# Patient Record
Sex: Male | Born: 1995
Health system: Southern US, Community
[De-identification: ages and names within clinical notes are randomized; demographics above are authoritative.]

## PROBLEM LIST (undated history)

## (undated) DIAGNOSIS — R569 Unspecified convulsions: Secondary | ICD-10-CM

## (undated) HISTORY — DX: Unspecified convulsions: R56.9

---

## 2016-04-03 ENCOUNTER — Emergency Department (HOSPITAL_COMMUNITY): Payer: BLUE CROSS/BLUE SHIELD

## 2016-04-03 ENCOUNTER — Emergency Department (HOSPITAL_COMMUNITY)
Admission: EM | Admit: 2016-04-03 | Discharge: 2016-04-03 | Disposition: A | Discharge status: Home / Self Care | Payer: BLUE CROSS/BLUE SHIELD | Attending: Emergency Medicine | Admitting: Emergency Medicine

## 2016-04-03 ENCOUNTER — Encounter (HOSPITAL_COMMUNITY): Payer: Self-pay | Admitting: Emergency Medicine

## 2016-04-03 DIAGNOSIS — Y92410 Unspecified street and highway as the place of occurrence of the external cause: Secondary | ICD-10-CM | POA: Diagnosis not present

## 2016-04-03 DIAGNOSIS — M25561 Pain in right knee: Secondary | ICD-10-CM

## 2016-04-03 DIAGNOSIS — Y999 Unspecified external cause status: Secondary | ICD-10-CM | POA: Diagnosis not present

## 2016-04-03 DIAGNOSIS — S80211A Abrasion, right knee, initial encounter: Secondary | ICD-10-CM | POA: Insufficient documentation

## 2016-04-03 DIAGNOSIS — T148XXA Other injury of unspecified body region, initial encounter: Secondary | ICD-10-CM

## 2016-04-03 DIAGNOSIS — Y9389 Activity, other specified: Secondary | ICD-10-CM | POA: Diagnosis not present

## 2016-04-03 DIAGNOSIS — S8991XA Unspecified injury of right lower leg, initial encounter: Secondary | ICD-10-CM | POA: Diagnosis present

## 2016-04-03 DIAGNOSIS — S60511A Abrasion of right hand, initial encounter: Secondary | ICD-10-CM | POA: Diagnosis not present

## 2016-04-03 MED ORDER — IBUPROFEN 800 MG PO TABS: 800.0000 mg | ORAL_TABLET | Freq: Three times a day (TID) | ORAL | 0 refills | Status: AC | PRN
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
## 2016-04-03 NOTE — Discharge Instructions (Signed)
Keep abrasions clean with soap and water. Ibuprofen as needed for pain. Ice affected area for additional pain relief.  Follow up with your primary care provider if symptoms are not improving in one week.  Return to ER for new or worsening symptoms, any additional concerns.
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
## 2016-04-03 NOTE — ED Notes (Signed)
Patient was alert, oriented and stable upon discharge. RN went over AVS and patient had no further questions.  

## 2016-09-23 ENCOUNTER — Emergency Department (HOSPITAL_COMMUNITY): Payer: Self-pay

## 2016-09-23 ENCOUNTER — Encounter (HOSPITAL_COMMUNITY): Payer: Self-pay | Admitting: Emergency Medicine

## 2016-09-23 ENCOUNTER — Inpatient Hospital Stay (HOSPITAL_COMMUNITY): Payer: Self-pay

## 2016-09-23 ENCOUNTER — Inpatient Hospital Stay (HOSPITAL_COMMUNITY)
Admission: EM | Admit: 2016-09-23 | Discharge: 2016-09-26 | DRG: 100 | Disposition: A | Discharge status: Home / Self Care | Payer: Self-pay | Attending: Internal Medicine | Admitting: Internal Medicine

## 2016-09-23 DIAGNOSIS — G9340 Encephalopathy, unspecified: Secondary | ICD-10-CM | POA: Diagnosis present

## 2016-09-23 DIAGNOSIS — F10129 Alcohol abuse with intoxication, unspecified: Secondary | ICD-10-CM | POA: Diagnosis present

## 2016-09-23 DIAGNOSIS — Z01818 Encounter for other preprocedural examination: Secondary | ICD-10-CM

## 2016-09-23 DIAGNOSIS — Y906 Blood alcohol level of 120-199 mg/100 ml: Secondary | ICD-10-CM | POA: Diagnosis present

## 2016-09-23 DIAGNOSIS — R64 Cachexia: Secondary | ICD-10-CM | POA: Diagnosis present

## 2016-09-23 DIAGNOSIS — J9601 Acute respiratory failure with hypoxia: Secondary | ICD-10-CM | POA: Diagnosis present

## 2016-09-23 DIAGNOSIS — J324 Chronic pansinusitis: Secondary | ICD-10-CM | POA: Diagnosis present

## 2016-09-23 DIAGNOSIS — G40401 Other generalized epilepsy and epileptic syndromes, not intractable, with status epilepticus: Principal | ICD-10-CM | POA: Diagnosis present

## 2016-09-23 DIAGNOSIS — R402142 Coma scale, eyes open, spontaneous, at arrival to emergency department: Secondary | ICD-10-CM | POA: Diagnosis present

## 2016-09-23 DIAGNOSIS — R4189 Other symptoms and signs involving cognitive functions and awareness: Secondary | ICD-10-CM

## 2016-09-23 DIAGNOSIS — F14129 Cocaine abuse with intoxication, unspecified: Secondary | ICD-10-CM | POA: Diagnosis present

## 2016-09-23 DIAGNOSIS — F449 Dissociative and conversion disorder, unspecified: Secondary | ICD-10-CM | POA: Diagnosis present

## 2016-09-23 DIAGNOSIS — Z820 Family history of epilepsy and other diseases of the nervous system: Secondary | ICD-10-CM

## 2016-09-23 DIAGNOSIS — R569 Unspecified convulsions: Secondary | ICD-10-CM

## 2016-09-23 DIAGNOSIS — G40901 Epilepsy, unspecified, not intractable, with status epilepticus: Secondary | ICD-10-CM

## 2016-09-23 DIAGNOSIS — R402312 Coma scale, best motor response, none, at arrival to emergency department: Secondary | ICD-10-CM | POA: Diagnosis present

## 2016-09-23 DIAGNOSIS — R402212 Coma scale, best verbal response, none, at arrival to emergency department: Secondary | ICD-10-CM | POA: Diagnosis present

## 2016-09-23 LAB — BASIC METABOLIC PANEL
ANION GAP: 13 (ref 5–15)
BUN: 9 mg/dL (ref 6–20)
CALCIUM: 8.7 mg/dL — AB (ref 8.9–10.3)
CO2: 22 mmol/L (ref 22–32)
CREATININE: 0.95 mg/dL (ref 0.61–1.24)
Chloride: 105 mmol/L (ref 101–111)
GFR calc Af Amer: >60 mL/min (ref >60)
GFR calc non Af Amer: >60 mL/min (ref >60)
Glucose, Bld: 96 mg/dL (ref 65–99)
Potassium: 3.9 mmol/L (ref 3.5–5.1)
Sodium: 140 mmol/L (ref 135–145)

## 2016-09-23 LAB — HEPATIC FUNCTION PANEL
ALBUMIN: 4.8 g/dL (ref 3.5–5.0)
ALK PHOS: 84 U/L (ref 38–126)
ALT: 13 U/L — ABNORMAL LOW (ref 17–63)
AST: 23 U/L (ref 15–41)
BILIRUBIN TOTAL: 0.9 mg/dL (ref 0.3–1.2)
Bilirubin, Direct: 0.1 mg/dL (ref 0.1–0.5)
Indirect Bilirubin: 0.8 mg/dL (ref 0.3–0.9)
TOTAL PROTEIN: 8.1 g/dL (ref 6.5–8.1)

## 2016-09-23 LAB — CBC WITH DIFFERENTIAL/PLATELET
Basophils Absolute: 0 10*3/uL (ref 0.0–0.1)
Basophils Relative: 0 %
EOS ABS: 0.1 10*3/uL (ref 0.0–0.7)
Eosinophils Relative: 1 %
HCT: 41 % (ref 39.0–52.0)
HEMOGLOBIN: 14.8 g/dL (ref 13.0–17.0)
LYMPHS ABS: 1.9 10*3/uL (ref 0.7–4.0)
LYMPHS PCT: 19 %
MCH: 29.3 pg (ref 26.0–34.0)
MCHC: 36.1 g/dL — AB (ref 30.0–36.0)
MCV: 81.2 fL (ref 78.0–100.0)
MONOS PCT: 7 %
Monocytes Absolute: 0.7 10*3/uL (ref 0.1–1.0)
NEUTROS PCT: 74 %
Neutro Abs: 7.5 10*3/uL (ref 1.7–7.7)
Platelets: 214 10*3/uL (ref 150–400)
RBC: 5.05 MIL/uL (ref 4.22–5.81)
RDW: 12.3 % (ref 11.5–15.5)
WBC: 10.2 10*3/uL (ref 4.0–10.5)

## 2016-09-23 LAB — RAPID URINE DRUG SCREEN, HOSP PERFORMED
AMPHETAMINES: NOT DETECTED
BARBITURATES: NOT DETECTED
Benzodiazepines: NOT DETECTED
Cocaine: POSITIVE — AB
Opiates: NOT DETECTED
TETRAHYDROCANNABINOL: NOT DETECTED

## 2016-09-23 LAB — CBC
HEMATOCRIT: 38.9 % — AB (ref 39.0–52.0)
Hemoglobin: 13.9 g/dL (ref 13.0–17.0)
MCH: 29 pg (ref 26.0–34.0)
MCHC: 35.7 g/dL (ref 30.0–36.0)
MCV: 81 fL (ref 78.0–100.0)
PLATELETS: 206 10*3/uL (ref 150–400)
RBC: 4.8 MIL/uL (ref 4.22–5.81)
RDW: 12.1 % (ref 11.5–15.5)
WBC: 17 10*3/uL — AB (ref 4.0–10.5)

## 2016-09-23 LAB — BLOOD GAS, ARTERIAL
Acid-base deficit: 2.1 mmol/L — ABNORMAL HIGH (ref 0.0–2.0)
Bicarbonate: 22.5 mmol/L (ref 20.0–28.0)
Drawn by: 11249
FIO2: 100
O2 SAT: 99.6 %
PEEP: 5 cmH2O
PH ART: 7.38 (ref 7.350–7.450)
Patient temperature: 96.8
RATE: 14 resp/min
VT: 580 mL
pCO2 arterial: 38.5 mmHg (ref 32.0–48.0)
pO2, Arterial: 415 mmHg — ABNORMAL HIGH (ref 83.0–108.0)

## 2016-09-23 LAB — I-STAT CHEM 8, ED
BUN: 11 mg/dL (ref 6–20)
CALCIUM ION: 1.06 mmol/L — AB (ref 1.15–1.40)
CHLORIDE: 101 mmol/L (ref 101–111)
Creatinine, Ser: 1.1 mg/dL (ref 0.61–1.24)
Glucose, Bld: 109 mg/dL — ABNORMAL HIGH (ref 65–99)
HCT: 44 % (ref 39.0–52.0)
Hemoglobin: 15 g/dL (ref 13.0–17.0)
POTASSIUM: 3.6 mmol/L (ref 3.5–5.1)
SODIUM: 140 mmol/L (ref 135–145)
TCO2: 26 mmol/L (ref 0–100)

## 2016-09-23 LAB — ACETAMINOPHEN LEVEL: Acetaminophen (Tylenol), Serum: <10 ug/mL — ABNORMAL LOW (ref 10–30)

## 2016-09-23 LAB — HIV ANTIBODY (ROUTINE TESTING W REFLEX): HIV Screen 4th Generation wRfx: NONREACTIVE

## 2016-09-23 LAB — ETHANOL: ALCOHOL ETHYL (B): 197 mg/dL — AB (ref <5)

## 2016-09-23 LAB — PHOSPHORUS: Phosphorus: 4.4 mg/dL (ref 2.5–4.6)

## 2016-09-23 LAB — MAGNESIUM
MAGNESIUM: 2.1 mg/dL (ref 1.7–2.4)
Magnesium: 1.9 mg/dL (ref 1.7–2.4)

## 2016-09-23 LAB — CBG MONITORING, ED: Glucose-Capillary: 109 mg/dL — ABNORMAL HIGH (ref 65–99)

## 2016-09-23 LAB — MRSA PCR SCREENING: MRSA by PCR: NEGATIVE

## 2016-09-23 MED ORDER — NALOXONE HCL 0.4 MG/ML IJ SOLN: 0.4000 mg | Freq: Once | INTRAMUSCULAR | Status: DC

## 2016-09-23 MED ORDER — FAMOTIDINE IN NACL 20-0.9 MG/50ML-% IV SOLN
20.0000 mg | Freq: Two times a day (BID) | INTRAVENOUS | Status: DC
  Administered 2016-09-23 – 2016-09-24 (×3): 20 mg via INTRAVENOUS
  Filled 2016-09-23 (×3): qty 50

## 2016-09-23 MED ORDER — FENTANYL CITRATE (PF) 100 MCG/2ML IJ SOLN
100.0000 ug | INTRAMUSCULAR | Status: DC | PRN
  Administered 2016-09-23: 100 ug via INTRAVENOUS
  Filled 2016-09-23: qty 2

## 2016-09-23 MED ORDER — PROPOFOL 1000 MG/100ML IV EMUL
INTRAVENOUS | Status: AC
  Filled 2016-09-23: qty 100

## 2016-09-23 MED ORDER — NALOXONE HCL 0.4 MG/ML IJ SOLN
INTRAMUSCULAR | Status: AC
  Filled 2016-09-23: qty 1

## 2016-09-23 MED ORDER — HEPARIN SODIUM (PORCINE) 5000 UNIT/ML IJ SOLN: 5000.0000 [IU] | Freq: Three times a day (TID) | INTRAMUSCULAR | Status: DC

## 2016-09-23 MED ORDER — SODIUM CHLORIDE 0.9 % IV SOLN
250.0000 mL | INTRAVENOUS | Status: DC | PRN
Start: 2016-09-23 — End: 2016-09-26

## 2016-09-23 MED ORDER — PROPOFOL 1000 MG/100ML IV EMUL
0.0000 ug/kg/min | INTRAVENOUS | Status: DC
  Administered 2016-09-23: 30 ug/kg/min via INTRAVENOUS
  Administered 2016-09-23 (×2): 50 ug/kg/min via INTRAVENOUS
  Administered 2016-09-23: 40 ug/kg/min via INTRAVENOUS
  Administered 2016-09-23: 50 ug/kg/min via INTRAVENOUS
  Administered 2016-09-24: 30 ug/kg/min via INTRAVENOUS
  Filled 2016-09-23 (×6): qty 100

## 2016-09-23 MED ORDER — PROPOFOL 1000 MG/100ML IV EMUL
0.0000 ug/kg/min | INTRAVENOUS | Status: DC
  Administered 2016-09-23: 30 ug/kg/min via INTRAVENOUS

## 2016-09-23 MED ORDER — SODIUM CHLORIDE 0.9 % IV SOLN: 0.0000 ug/min | INTRAVENOUS | Status: DC

## 2016-09-23 MED ORDER — ACETAMINOPHEN 160 MG/5ML PO SOLN
500.0000 mg | Freq: Four times a day (QID) | ORAL | Status: DC | PRN
  Administered 2016-09-23: 500 mg
  Filled 2016-09-23: qty 20.3

## 2016-09-23 MED ORDER — LACTATED RINGERS IV SOLN
INTRAVENOUS | Status: DC
  Administered 2016-09-23 – 2016-09-24 (×3): via INTRAVENOUS

## 2016-09-23 MED ORDER — SODIUM CHLORIDE 0.9 % IV SOLN
500.0000 mg | Freq: Two times a day (BID) | INTRAVENOUS | Status: DC
  Administered 2016-09-23 – 2016-09-24 (×3): 500 mg via INTRAVENOUS
  Filled 2016-09-23 (×4): qty 5

## 2016-09-23 MED ORDER — SODIUM CHLORIDE 0.9 % IV SOLN
1000.0000 mg | Freq: Once | INTRAVENOUS | Status: AC
  Administered 2016-09-23: 1000 mg via INTRAVENOUS
  Filled 2016-09-23: qty 10

## 2016-09-23 MED ORDER — LIDOCAINE HCL 2 % EX GEL
CUTANEOUS | Status: AC
  Filled 2016-09-23: qty 11

## 2016-09-23 MED ORDER — LORAZEPAM 2 MG/ML IJ SOLN
1.0000 mg | Freq: Once | INTRAMUSCULAR | Status: AC
  Administered 2016-09-23: 1 mg via INTRAVENOUS

## 2016-09-23 MED ORDER — ENOXAPARIN SODIUM 40 MG/0.4ML ~~LOC~~ SOLN
40.0000 mg | Freq: Every day | SUBCUTANEOUS | Status: DC
  Administered 2016-09-23 – 2016-09-24 (×2): 40 mg via SUBCUTANEOUS
  Filled 2016-09-23 (×2): qty 0.4

## 2016-09-23 MED ORDER — CHLORHEXIDINE GLUCONATE 0.12% ORAL RINSE (MEDLINE KIT)
15.0000 mL | Freq: Two times a day (BID) | OROMUCOSAL | Status: DC
  Administered 2016-09-23 – 2016-09-24 (×3): 15 mL via OROMUCOSAL

## 2016-09-23 MED ORDER — PANTOPRAZOLE SODIUM 40 MG IV SOLR
40.0000 mg | Freq: Every day | INTRAVENOUS | Status: DC
  Administered 2016-09-23: 40 mg via INTRAVENOUS
  Filled 2016-09-23: qty 40

## 2016-09-23 MED ORDER — LORAZEPAM 2 MG/ML IJ SOLN
INTRAMUSCULAR | Status: AC
  Administered 2016-09-23: 1 mg via INTRAVENOUS
  Filled 2016-09-23: qty 1

## 2016-09-23 MED ORDER — FENTANYL CITRATE (PF) 100 MCG/2ML IJ SOLN: 100.0000 ug | INTRAMUSCULAR | Status: DC | PRN

## 2016-09-23 MED ORDER — ORAL CARE MOUTH RINSE
15.0000 mL | OROMUCOSAL | Status: DC
  Administered 2016-09-23 – 2016-09-24 (×13): 15 mL via OROMUCOSAL

## 2016-09-23 NOTE — ED Notes (Signed)
Patient moving arms and legs-attempts to open eyes when name called. Pupils are 18mm bilaterally and equal with brisk reaction.
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
## 2016-09-23 NOTE — Progress Notes (Signed)
LTM changed to routine EEG per Dr. Laurence Slate. EEG Completed; Results Pending

## 2016-09-23 NOTE — ED Notes (Signed)
E-Link notified of patient's agitation-will place order for sedation

## 2016-09-23 NOTE — ED Notes (Signed)
Pt was intubated by provider at 1:43am  amidate 20mg   given then flush  Rocuronium 60 mg given then flush  Respiratory in room bagging  24 at the lip/ size 7.5 tube

## 2016-09-23 NOTE — Progress Notes (Signed)
Initial Nutrition Assessment  DOCUMENTATION CODES:   Not applicable  INTERVENTION:  If unable to extubate, Recommend TF via OGT with Vital AF 1.2 at goal rate of 45 ml/h (1080 ml per day) and Prostat 30 ml once daily. Tube feeding regimen with current propofol rate will provide 1916 kcals, 96 gm protein, 875 ml free water daily.  NUTRITION DIAGNOSIS:   Inadequate oral intake related to inability to eat as evidenced by NPO status.  GOAL:   Patient will meet greater than or equal to 90% of their needs  MONITOR:   Vent status, Skin, Weight trends, Labs, I & O's  REASON FOR ASSESSMENT:   Consult Assessment of nutrition requirement/status (TF recommendations)  ASSESSMENT:   21 yo M presented because of seizure like activity, found to be ETOH and cocaine positive. Intubated for airway protection.  Patient is currently intubated on ventilator support MV: 10.3 L/min Temp (24hrs), Avg:99.7 F (37.6 C), Min:96.1 F (35.6 C), Max:101.3 F (38.5 C)  Propofol: 19.7 ml/hr which provides 520 kcal/day  Per MD note, considering extubation in the afternoon/evening atfer hydration but will start TF in the morning is unable to extubate.  Nutrition-Focused physical exam completed. Findings are no fat depletion, mild to moderate muscle depletion, and no edema.   Labs and medications reviewed.   Diet Order:  Diet NPO time specified  Skin:  Reviewed, no issues  Last BM:  Unknown  Height:   Ht Readings from Last 1 Encounters:  09/23/16 5\' 10"  (1.778 m)    Weight:   Wt Readings from Last 1 Encounters:  09/23/16 136 lb 3.9 oz (61.8 kg)    Ideal Body Weight:  75.45 kg  BMI:  Body mass index is 19.55 kg/m.  Estimated Nutritional Needs:   Kcal:  1956  Protein:  95-110 grams  Fluid:  Per MD  EDUCATION NEEDS:   No education needs identified at this time  Roslyn Smiling, MS, RD, LDN Pager # [phone] After hours/ weekend pager # [phone]
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
## 2016-09-23 NOTE — ED Provider Notes (Signed)
[facility]-EMERGENCY DEPT Provider Note   CSN: [id number] Arrival date & time: 09/23/16  0046     History   Chief Complaint Chief Complaint  Patient presents with  . Unresponsive  . Seizures    HPI Anson Peddie is a 21 y.o. male.  HPI Pt comes in with cc of seizure. LEVEL 5 CAVEAT FOR SEIZURE / AMS. Pt brought in by friends with active seizures. Per friends they were at a part and pt had 2  Episodes of seizures, tonic clonic jerking lasting few seconds each. After the 2nd episode they decided to drive him here. They are not aware of any medical conditions. There was drinking at the party, the friends report that pt had maybe mentioned taking mollies at some point but they didn't actually see any drugs.   History reviewed. No pertinent past medical history.  Patient Active Problem List   Diagnosis Date Noted  . Encounter for intubation   . Seizures (HCC) 09/23/2016  . Seizure (HCC) 09/23/2016  . Unresponsive state   . Status epilepticus (HCC)     History reviewed. No pertinent surgical history.     Home Medications    Prior to Admission medications   Medication Sig Start Date End Date Taking? Authorizing Provider  ibuprofen (ADVIL,MOTRIN) 800 MG tablet Take 1 tablet (800 mg total) by mouth every 8 (eight) hours as needed. Patient not taking: Reported on 09/23/2016 04/03/16   Ward, Chase Picket, PA-C    Family History No family history on file.  Social History Social History  Substance Use Topics  . Smoking status: Never Smoker  . Smokeless tobacco: Never Used  . Alcohol use Yes     Allergies   Patient has no known allergies.   Review of Systems Review of Systems  Unable to perform ROS: Patient unresponsive     Physical Exam Updated Vital Signs BP (!) 103/52   Pulse (!) 52   Temp 98 F (36.7 C) (Oral)   Resp 15   Ht 5\' 10"  (1.778 m)   Wt 65.2 kg (143 lb 11.8 oz)   SpO2 98%   BMI 20.62 kg/m   Physical Exam  Constitutional: He appears  well-developed.  HENT:  Head: Atraumatic.  Eyes:  4 mm and equal - upwards gaze initially that normalized  Neck: Neck supple.  Cardiovascular: Normal rate.   Pulmonary/Chest: Effort normal. He has no wheezes.  Abdominal: Soft.  Neurological:  GCS - 6 (e-4/v-1/m-1). No gag reflex  Skin: Skin is warm. He is diaphoretic.  Nursing note and vitals reviewed.    ED Treatments / Results  Labs (all labs ordered are listed, but only abnormal results are displayed) Labs Reviewed  CBC WITH DIFFERENTIAL/PLATELET - Abnormal; Notable for the following:       Result Value   MCHC 36.1 (*)    All other components within normal limits  ACETAMINOPHEN LEVEL - Abnormal; Notable for the following:    Acetaminophen (Tylenol), Serum <10 (*)    All other components within normal limits  ETHANOL - Abnormal; Notable for the following:    Alcohol, Ethyl (B) 197 (*)    All other components within normal limits  HEPATIC FUNCTION PANEL - Abnormal; Notable for the following:    ALT 13 (*)    All other components within normal limits  RAPID URINE DRUG SCREEN, HOSP PERFORMED - Abnormal; Notable for the following:    Cocaine POSITIVE (*)    All other components within normal limits  BLOOD GAS, ARTERIAL -  Abnormal; Notable for the following:    pO2, Arterial 415 (*)    Acid-base deficit 2.1 (*)    All other components within normal limits  CBC - Abnormal; Notable for the following:    WBC 17.0 (*)    HCT 38.9 (*)    All other components within normal limits  BASIC METABOLIC PANEL - Abnormal; Notable for the following:    Calcium 8.7 (*)    All other components within normal limits  CBC - Abnormal; Notable for the following:    WBC 11.5 (*)    All other components within normal limits  CBG MONITORING, ED - Abnormal; Notable for the following:    Glucose-Capillary 109 (*)    All other components within normal limits  I-STAT CHEM 8, ED - Abnormal; Notable for the following:    Glucose, Bld 109 (*)     Calcium, Ion 1.06 (*)    All other components within normal limits  MRSA PCR SCREENING  MAGNESIUM  PHOSPHORUS  HIV ANTIBODY (ROUTINE TESTING)  MAGNESIUM  BASIC METABOLIC PANEL  CBG MONITORING, ED    EKG  EKG Interpretation  Date/Time:  Saturday September 23 2016 00:48:13 EDT Ventricular Rate:  85 PR Interval:    QRS Duration: 81 QT Interval:  354 QTC Calculation: 421 R Axis:   77 Text Interpretation:  Sinus rhythm Consider right atrial enlargement No acute changes No old tracing to compare Confirmed by Derwood Kaplan ([id number]) on 09/23/2016 12:56:48 AM       Radiology Dg Chest Port 1 View  Result Date: 09/24/2016 CLINICAL DATA:  Intubation. EXAM: PORTABLE CHEST 1 VIEW COMPARISON:  Chest radiograph September 23, 2016 FINDINGS: Endotracheal tube tip projects 2.5 cm above the carina. Nasogastric tube past the GE junction, distal tip out of field-of-view. Cardiomediastinal silhouette is normal. No pleural effusions or focal consolidations. Trachea projects midline and there is no pneumothorax. Soft tissue planes and included osseous structures are non-suspicious. IMPRESSION: Endotracheal tube tip projects 2.5 cm above the carina. Nasogastric tube past the GE junction. No acute cardiopulmonary process. Electronically Signed   By: Awilda Metro M.D.   On: 09/24/2016 03:38    Procedures Procedures (including critical care time)  INTUBATION Performed by: Derwood Kaplan  Required items: required blood products, implants, devices, and special equipment available Patient identity confirmed: provided demographic data and hospital-assigned identification number Time out: Immediately prior to procedure a "time out" was called to verify the correct patient, procedure, equipment, support staff and site/side marked as required.  Indications: airway protection  Intubation method: Direct Laryngoscopy   Preoxygenation: BVM  Sedatives: Etomidate Paralytic: Rocuronium  Tube Size: 7.5  cuffed  Post-procedure assessment: chest rise and ETCO2 monitor Breath sounds: equal and absent over the epigastrium Tube secured with: ETT holder Chest x-ray interpreted by radiologist and me.  Chest x-ray findings: endotracheal tube in appropriate position  Patient tolerated the procedure well with no immediate complications.   CRITICAL CARE Performed by: Derwood Kaplan   Total critical care time: 40 minutes  Critical care time was exclusive of separately billable procedures and treating other patients.  Critical care was necessary to treat or prevent imminent or life-threatening deterioration.  Critical care was time spent personally by me on the following activities: development of treatment plan with patient and/or surrogate as well as nursing, discussions with consultants, evaluation of patient's response to treatment, examination of patient, obtaining history from patient or surrogate, ordering and performing treatments and interventions, ordering and review of laboratory studies, ordering  and review of radiographic studies, pulse oximetry and re-evaluation of patient's condition.   Medications Ordered in ED Medications  lidocaine (XYLOCAINE) 2 % jelly (not administered)  propofol (DIPRIVAN) 1000 MG/100ML infusion (not administered)  0.9 %  sodium chloride infusion (not administered)  propofol (DIPRIVAN) 1000 MG/100ML infusion (not administered)  levETIRAcetam (KEPPRA) tablet 500 mg (500 mg Oral Given 09/25/16 1046)  acetaminophen (TYLENOL) tablet 650 mg (650 mg Oral Given 09/25/16 0508)  feeding supplement (ENSURE ENLIVE) (ENSURE ENLIVE) liquid 237 mL (237 mLs Oral Not Given 09/25/16 1100)  LORazepam (ATIVAN) injection 1 mg (1 mg Intravenous Given 09/23/16 0128)  levETIRAcetam (KEPPRA) 1,000 mg in sodium chloride 0.9 % 100 mL IVPB (0 mg Intravenous Stopped 09/23/16 0358)     Initial Impression / Assessment and Plan / ED Course  I have reviewed the triage vital signs and  the nursing notes.  Pertinent labs & imaging results that were available during my care of the patient were reviewed by me and considered in my medical decision making (see chart for details).  Clinical Course as of Sep 25 1448  Sat Sep 23, 2016  0155 Pt had seizure # 4 and 5 in the ER. Poor gag. GCS never improved between the seizures. Worried that he might be having subclinical seizures as well. Will intubate.  [AN]    Clinical Course User Index [AN] Derwood Kaplan, MD    Pt comes in unresponsive. CBG is normal. Pt was having seizure like activity, one of them witnessed here. Pt essentially having status epilepticus. We suspect toxin ingestion, although history is limited. No signs of trauma. Bed at 30 degrees right now. Close monitoring initiated. Narcan 0.4 mg ordered.  Final Clinical Impressions(s) / ED Diagnoses   Final diagnoses:  Seizure (HCC)  Unresponsive state  Status epilepticus [facility])    New Prescriptions Current Discharge Medication List       Derwood Kaplan, MD 09/25/16 1450

## 2016-09-23 NOTE — Progress Notes (Addendum)
Reason for ICU admission: Seizure, acute hypoxic resp failure  In summary: 21 yo M presented because of seizure like activity, found to be ETOH and cocaine positive. Intubated for airway protection. CT of the head is negative   Subjective : sedated on vent  No further seizure act .  EEG pending   On my assessment: Vitals:   09/23/16 0900 09/23/16 0915  BP: 108/64 114/63  Pulse: 80 84  Resp: 14 14  Temp: (!) 100.9 F (38.3 C) (!) 100.9 F (38.3 C)  SpO2: 98% 99%    Physical Exam  Constitutional: He appears well-developed. Sedated on vent  HEENT : ETT  Neck: Neck supple.  Cardiovascular: Normal rate.   Pulmonary/Chest: Effort normal. He has no wheezes.  Abdominal: Soft.  Neurological:  Sedated on vent . RAAS -1    Assessment :  1. Acute hypoxic resp failure 2. Seizure-neg CT head, EEG pending  3. ETOH/Drug abuse  Plan: - appreciate neuro assistance  - Cont Keppra .  Sedation with Diprivan/Fent  - Ativan As needed   - cont vent support until mentation improves.  - IVF, monitor UOP - PUD and DVT prophylaxis  Update: mom and dad updated at bedside 8/18  Tammy Parrett NP-C  [facility]  [phone]   09/23/2016   STAFF NOTE: Cindi Carbon, MD FACP have personally reviewed patient's available data, including medical history, events of note, physical examination and test results as part of my evaluation. I have discussed with resident/NP and other care providers such as pharmacist, RN and RRT. 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, coc, mDMA, neuro on board,  Prop to rass -2 to -3, ABg reviewed keep same MV, SBT this afternoon assess rsbi, would consider extubation in  afternoon evening if after hydration to clear etoh and no focus noted, start TF in am if not extubated, ppi, sub q hep, chem in am, some fever 101 without infection noted, may have pnuemonitis but pcxr neg, if spike further add unasyn, get sputum, I updated mom, also fever from mdma as slurce likley, may need versed for this The patient is critically ill with multiple organ systems failure and requires high complexity decision making for assessment and support, frequent evaluation and titration of therapies, application of advanced monitoring technologies and extensive interpretation of multiple databases.   Critical Care Time devoted to patient care services described in this note is 50 Minutes. This time reflects time of care of this signee: Rory Percy, MD FACP. This critical care time does not reflect procedure time, or teaching time or supervisory time of PA/NP/Med student/Med Resident etc but could involve care discussion time. Rest per NP/medical resident whose note is outlined above and that I agree with   Mcarthur Rossetti. Tyson Alias, MD, FACP Pgr: [phone] [facility] 09/23/2016 12:12 PM

## 2016-09-23 NOTE — Consult Note (Signed)
Reason for ICU admission: Seizure, acute hypoxic resp failure  In summary: 21 yo M presented because of seizure like activity, found to be ETOH and cocaine positive. Intubated for airway protection. CT of the head is negative   On my assessment: BP 112/75   Pulse 89   Resp (!) 21   SpO2 92%   Physical Exam  Constitutional: He appears well-developed.  Eyes: 4 mm and equal - upwards gaze initially that normalized  Neck: Neck supple.  Cardiovascular: Normal rate.   Pulmonary/Chest: Effort normal. He has no wheezes.  Abdominal: Soft.  Neurological:  GCS - 6 (e-4/v-1/m-1). No gag reflex   I reviewed labs and imaging  Patient is critically ill in the ICU and I am managing the patient for: 1. Acute hypoxic resp failure 2. Seizure 3. Drug abuse  Plan: - admit to ICU - Keppra, EEG, CT of the head done and was negative. Neuro consult - ABG, acceptable, CXR reviewed, minima vent setting. VAP protocol - IVF, monitor UOP - PUD and DVT prophylaxis  I spent 35 min of critical care time managing the patient   Oswaldo Milian, MD CCM attending

## 2016-09-23 NOTE — ED Triage Notes (Signed)
Pt brought in by two friends after being at a house where friends say he may have consumed a good amount of alcohol and possible taken molly.  Friends unable to answer many questions regarding patients.  State he had 3 seizures in route. Found diaphoretic. Pt unresponsive at this time.

## 2016-09-23 NOTE — ED Notes (Signed)
Patient awake and shaking head-oropharynx suctioned for moderate amount clear blood tinged secretions-RT at bedside

## 2016-09-23 NOTE — ED Notes (Signed)
Patient is currently sedated on propofol-no change in vent settings-VSS/see flow sheet. MP SR with rate 70's with no ectopy. Temp foley intact and draining clear yellow urine. OGT in place draining mucous and light bile colored fluid.

## 2016-09-23 NOTE — ED Notes (Signed)
To CT and returned-patient remains unresponsive to tactile/noxious stimuli

## 2016-09-23 NOTE — Progress Notes (Signed)
Patient moving all 4 extremities, trying to self extubate. Not following commands, EEg shows no seizures.

## 2016-09-24 ENCOUNTER — Inpatient Hospital Stay (HOSPITAL_COMMUNITY): Payer: Self-pay

## 2016-09-24 DIAGNOSIS — G40901 Epilepsy, unspecified, not intractable, with status epilepticus: Secondary | ICD-10-CM

## 2016-09-24 DIAGNOSIS — R4189 Other symptoms and signs involving cognitive functions and awareness: Secondary | ICD-10-CM

## 2016-09-24 DIAGNOSIS — Z01818 Encounter for other preprocedural examination: Secondary | ICD-10-CM

## 2016-09-24 DIAGNOSIS — R569 Unspecified convulsions: Secondary | ICD-10-CM

## 2016-09-24 LAB — BASIC METABOLIC PANEL
Anion gap: 11 (ref 5–15)
BUN: 10 mg/dL (ref 6–20)
CALCIUM: 9 mg/dL (ref 8.9–10.3)
CO2: 22 mmol/L (ref 22–32)
CREATININE: 1.08 mg/dL (ref 0.61–1.24)
Chloride: 106 mmol/L (ref 101–111)
GFR calc non Af Amer: >60 mL/min (ref >60)
Glucose, Bld: 73 mg/dL (ref 65–99)
Potassium: 4.3 mmol/L (ref 3.5–5.1)
Sodium: 139 mmol/L (ref 135–145)

## 2016-09-24 LAB — CBC
HEMATOCRIT: 41.5 % (ref 39.0–52.0)
Hemoglobin: 14.1 g/dL (ref 13.0–17.0)
MCH: 28.7 pg (ref 26.0–34.0)
MCHC: 34 g/dL (ref 30.0–36.0)
MCV: 84.5 fL (ref 78.0–100.0)
PLATELETS: 171 10*3/uL (ref 150–400)
RBC: 4.91 MIL/uL (ref 4.22–5.81)
RDW: 12.8 % (ref 11.5–15.5)
WBC: 11.5 10*3/uL — ABNORMAL HIGH (ref 4.0–10.5)

## 2016-09-24 MED ORDER — ACETAMINOPHEN 325 MG PO TABS
650.0000 mg | ORAL_TABLET | Freq: Four times a day (QID) | ORAL | Status: DC | PRN
  Administered 2016-09-24 – 2016-09-26 (×4): 650 mg via ORAL
  Filled 2016-09-24 (×4): qty 2

## 2016-09-24 MED ORDER — ACETAMINOPHEN 160 MG/5ML PO SOLN: 650.0000 mg | Freq: Four times a day (QID) | ORAL | Status: DC | PRN

## 2016-09-24 MED ORDER — LEVETIRACETAM 500 MG PO TABS
500.0000 mg | ORAL_TABLET | Freq: Two times a day (BID) | ORAL | Status: DC
  Administered 2016-09-24 – 2016-09-26 (×4): 500 mg via ORAL
  Filled 2016-09-24 (×4): qty 1

## 2016-09-24 NOTE — Progress Notes (Signed)
Reason for ICU admission: Seizure, acute hypoxic resp failure  In summary: 21 yo M presented because of seizure like activity, found to be ETOH and cocaine positive. Intubated for airway protection. CT of the head is negative   Subjective : sedated on vent , wakes up , f/c.  No further seizure act .  EEG showed generalized cerebral dysfunction, and intermittent generalized spike and wave discharges with frontal predominance; no active seizures.   On my assessment: Vitals:   09/24/16 1045 09/24/16 1100  BP: 127/67 117/74  Pulse: 64 67  Resp: 15 19  Temp:    SpO2: 100% 100%    Physical Exam  Constitutional: He appears well-developed. Sedated on vent  HEENT : ETT  Neck: Neck supple.  Cardiovascular: Normal rate.   Pulmonary/Chest: Effort normal. He has no wheezes.  Abdominal: Soft.  Neurological:  Sedated on vent . RAAS -1    Assessment :  1. Acute hypoxic resp failure 2. Seizure-neg CT head, EEG pending  3. ETOH/Drug abuse  Plan: - appreciate neuro assistance  - Cont Keppra .  Wean sedation  eval for extubation .  Sedation with Diprivan/Fent  - Ativan As needed   - IVF, monitor UOP - PUD and DVT prophylaxis  Update: mom and dad updated at bedside 8/19  Tammy Parrett NP-C  [facility]  [phone]   09/24/2016   STAFF NOTE: Linwood Dibbles, MD FACP have personally reviewed patient's available data, including medical history, events of note, physical examination and test results as part of my evaluation. I have discussed with resident/NP and other care providers such as pharmacist, RN and RRT. In addition, I personally evaluated patient and elicited key findings of: awakens, rass 0, on prop, on going wua, follows commands, lungs are clear, abdo soft, pcxr I reviewed shows NO asp PNA no new infiltrates, no seizures overnight, wean aggressive now cpap 5 ps 5, goal 30 min  Met, off prop strong cough, assessing leak - present, anti seizure per  neuro, proceed with extubation, keep pos balance with poly substance, no role abx, I updated his parents in full The patient is critically ill with multiple organ systems failure and requires high complexity decision making for assessment and support, frequent evaluation and titration of therapies, application of advanced monitoring technologies and extensive interpretation of multiple databases.   Critical Care Time devoted to patient care services described in this note is 30 Minutes. This time reflects time of care of this signee: Merrie Roof, MD FACP. This critical care time does not reflect procedure time, or teaching time or supervisory time of PA/NP/Med student/Med Resident etc but could involve care discussion time. Rest per NP/medical resident whose note is outlined above and that I agree with   Lavon Paganini. Titus Mould, MD, [place] Pgr: [facility] 09/24/2016 12:58 PM

## 2016-09-24 NOTE — Progress Notes (Signed)
When suctioning pt, pt reached up and grabbed tube and pulled it out to 22cm at lip.  RT pushed tube back to 25 at the lip and checked placement with ETCO2 which had good color change with bagging.  Rt will continue to monitor.

## 2016-09-24 NOTE — Progress Notes (Signed)
RT Note: Patient was placed on wean earlier by Fannie Knee, RRT and initially was tolerating it well. Patient then was placed back on his rest mode due to increased respiratory rate of 60, increased agitation, and increased work of breathing. He is stable now on his rest mode and is maintaining well on PRVC. Rt will continue to monitor and assist as needed.

## 2016-09-24 NOTE — Progress Notes (Signed)
Subjective: Intubated and sedated on my visit. He will open his eyes occasionally and follow some commands, showing two fingers and a thumbs up when requested.   I spoke to his mother on my visit, and she noted that "this all makes sense" after discussing cEEG findings - mom has diagnosed epilepsy and brother has had seizures following drug use.  Pertinent diagnostics: CT head: normal cEEG showed generalized cerebral dysfunction, and intermittent generalized spike and wave discharges with frontal predominance; no active seizures.   Pertinent medications 500mg  Keppra BID  Physical Examination: Vitals:   09/24/16 0645 09/24/16 0758  BP: 123/66 123/62  Pulse: (!) 53 (!) 54  Resp: 14 14  Temp:    SpO2: 100% 100%    General: WDWN male.  HEENT:  Normocephalic, no lesions, without obvious abnormality.  Normal external eye and conjunctiva.  Normal external ears. Normal external nose, mucus membranes and septum.  Normal pharynx. Cardiovascular: regular rate and rhythm, pulses palpable throughout   Pulmonary: Ventilated Abdomen: Soft, non-tender Extremities: no joint deformities, effusion, or inflammation  Neurological Examination:  CN: Pupils are equal, round and symmetrically reactive. Upper face is symmetric at rest. Hearing is intact to conversational voice. Tongue is midline.  Motor: Normal bulk, tone, and strength. Sensation: Does not withdraw to noxious stimuli in the BLE but will wake to sternal rub DTRs: Diminished throughout.  Toes downgoing bilaterally. Coordination: He does not perform   Assessment:   Seizures in the setting of Substance Abuse Migraines with Hemiparesis ? Possible underlying Seizure disorder with abnormal EEG   Jay Aguirre is a 21 year old male with a history of conversion disorder, polysubstance abuse and a family history of seizure who was brought in to [facility] following two witnessed seizures in the field. He then had two additional seizures in the ED.  UDS was positive for cocaine and alcohol. EEG showed generalized epileptogenic dysfunction with a frontal predominance, suggesting  A possible  underlying epilepsy disorder that was likely revealed due to reduced seizure threshold in the setting of alcohol and cocaine use. Mother has Partial epilepsy that presented late onset.  I would suggest to keep him on Keppra 500mg  BID until follow up with an outpatient neurologist.    Recommendations: 1) Continue Keppra 500 mG BID  2) Follow up Neurology as outpatient     Bruna Potter PA-C [facility] [phone]  09/24/2016, 8:07 AM  NEUROHOSPITALIST ADDENDUM Seen and examined the patient this AM. Formulated plan as documented above. Recommendations as above.  He has  Just been extubated. EEG showed Intermittent generalized spike and wave discharges with frontal predominance, suggesting possible underlying seizure disorder as well.   No driving x 6 months Continue Keppra Avoid alcohol and cocaine and illicit substances Seizure precuations   Per [facility] statutes, patients with seizures are not allowed to drive until they have been seizure-free for six months. Use caution when using heavy equipment or power tools. Avoid working on ladders or at heights. Take showers instead of baths. Ensure the water temperature is not too high on the home water heater. Do not go swimming alone. Do not lock yourself in a room alone (i.e. bathroom). When caring for infants or small children, sit down when holding, feeding, or changing them to minimize risk of injury to the child in the event you have a seizure. Maintain good sleep hygiene. Avoid alcohol.    If [name] has another seizure, call 911 and bring them back to the ED if:  A.  The seizure lasts longer than 5 minutes.            B.  The patient doesn't wake shortly after the seizure or has new problems such as difficulty seeing, speaking or moving following the  seizure       C.  The patient was injured during the seizure       D.  The patient has a temperature over 102 F (39C)       E.  The patient vomited during the seizure and now is having trouble breathing    Georgiana Spinner Aroor MD [facility] [phone]  If 7pm to 7am, please call on call as listed on AMION.

## 2016-09-24 NOTE — Procedures (Signed)
Extubation Procedure Note  Patient Details:   Name: Jay Aguirre DOB: August 12, 1995 MRN: [id number]   Airway Documentation:     Evaluation  O2 sats: stable throughout Complications: No apparent complications Patient did tolerate procedure well. Bilateral Breath Sounds: Clear   Yes - pt currently whispering  Positive cuff leak noted.  Pt placed on [place] 2 L with humidity, no stridor noted.  Md, RN, and family in room.  Pt and family instructed on the use of incentive spirometer. Pt able to reach 1325 mL with coaching.  Jay Aguirre 09/24/2016, 1:09 PM

## 2016-09-25 MED ORDER — ENSURE ENLIVE PO LIQD
237.0000 mL | Freq: Two times a day (BID) | ORAL | Status: DC
  Administered 2016-09-25 – 2016-09-26 (×3): 237 mL via ORAL
  Filled 2016-09-25 (×4): qty 237

## 2016-09-25 NOTE — Progress Notes (Signed)
Pt arrived to 5C20 via wheelchair.  Pt ambulated from chair to bed.  Pt alert and oriented in no apparent distress and no complaints of pain.  VSS.  Will continue to monitor.  Sondra Come, RN

## 2016-09-25 NOTE — Discharge Instructions (Signed)
DISCHARGE INSTRUCTIONS  No driving x 6 months Continue Keppra Avoid alcohol and cocaine and illicit substances   Per [facility] statutes, patients with seizures are not allowed to drive until they have been seizure-free for six months. Use caution when using heavy equipment or power tools. Avoid working on ladders or at heights. Take showers instead of baths. Ensure the water temperature is not too high on the home water heater. Do not go swimming alone. Do not lock yourself in a room alone (i.e. bathroom). When caring for infants or small children, sit down when holding, feeding, or changing them to minimize risk of injury to the child in the event you have a seizure. Maintain good sleep hygiene. Avoid alcohol.    If Jay Aguirre has another seizure, call 911 and bring them back to the ED if: A. The seizure lasts longer than 5 minutes.  B. The patient doesn't wake shortly after the seizure or has new problems such as difficulty seeing, speaking or moving following the seizure C. The patient was injured during the seizure D. The patient has a temperature over 102 F (39C) E. The patient vomited during the seizure and now is having trouble breathing

## 2016-09-25 NOTE — Progress Notes (Signed)
Reason for ICU admission: Seizure, acute hypoxic resp failure  In summary: 21 yo M presented because of seizure lactivity, found to be ETOH and cocaine positive. Intubated for airway protection. Significant family history seizures CT of the head is negative  EEG shows probable epileptiform focus frontal predominance without any active seizures  Subjective :  No further seizure activity on current Keppra Tolerating diet Has ambulated   On my assessment: Vitals:   09/25/16 0700 09/25/16 0800  BP: (!) 100/59   Pulse: (!) 53   Resp: 17   Temp:  98.2 F (36.8 C)  SpO2: 97%     Physical Exam  Constitutional: Well-developed, a bit sleepy HEENT : Oropharynx clear, no lesions Neck: No stridor Cardiovascular: Regular, no murmur Pulmonary/Chest: Clear bilaterally Abdominal: Soft, nondistended, positive bowel sounds  Neurological:  Awake, interacting, moves all extremities, no focal deficits   Assessment :  1. Acute hypoxic resp failure, resolved 2. Seizures. Suspect underlying epilepsy that has now revealed itself in the setting of alcohol and cocaine use. 3. ETOH/Drug abuse  Plan: Appreciate neurology evaluation and assistance Plan to continue keppra 500 mg twice a day until he can be followed up by neurology as an outpatient. Discontinue LR Discharge home either 8/20 or 8/21  Update: mom updated at bedside 8/20  Levy Pupa, MD, PhD 09/25/2016, 10:45 AM [facility] [phone] or if no answer [phone]

## 2016-09-25 NOTE — Progress Notes (Signed)
Nutrition Follow Up Note   DOCUMENTATION CODES:   Not applicable  INTERVENTION:   Ensure Enlive po BID, each supplement provides 350 kcal and 20 grams of protein  MVI  NUTRITION DIAGNOSIS:   Inadequate oral intake related to inability to eat as evidenced by NPO status.  -improving- pt initiated on oral diet today  GOAL:   Patient will meet greater than or equal to 90% of their needs  MONITOR:   PO intake, Supplement acceptance, Labs, Weight trends  ASSESSMENT:   21 yo M presented because of seizure like activity, found to be ETOH and cocaine positive. Intubated for airway protection.  Pt extubated 8/19; tolerating well. Initiated on oral diet today. Will order Ensure to help pt meet estimated protein needs. Per chart, pt is weight stable since admit.   Medications reviewed   Labs reviewed: wbc- 11.5(H)  Diet Order:  Diet regular Room service appropriate? Yes; Fluid consistency: Thin  Skin:  Reviewed, no issues  Last BM:  PTA  Height:   Ht Readings from Last 1 Encounters:  09/23/16 5\' 10"  (1.778 m)    Weight:   Wt Readings from Last 1 Encounters:  09/25/16 143 lb 11.8 oz (65.2 kg)    Ideal Body Weight:  75.45 kg  BMI:  Body mass index is 20.62 kg/m.  Estimated Nutritional Needs:   Kcal:  1900-2200kcal/day   Protein:  72-85g/day   Fluid:  >1.9L/day   EDUCATION NEEDS:   No education needs identified at this time  Betsey Holiday MS, RD, LDN Pager #[phone] After Hours Pager: [phone]
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
## 2016-09-26 DIAGNOSIS — J9601 Acute respiratory failure with hypoxia: Secondary | ICD-10-CM

## 2016-09-26 MED ORDER — LEVETIRACETAM 500 MG PO TABS: 500.0000 mg | ORAL_TABLET | Freq: Two times a day (BID) | ORAL | 3 refills | Status: DC

## 2016-09-26 MED ORDER — PHENOL 1.4 % MT LIQD
1.0000 | OROMUCOSAL | Status: DC | PRN
  Administered 2016-09-26: 1 via OROMUCOSAL
  Filled 2016-09-26: qty 177

## 2016-09-26 NOTE — Care Management Note (Signed)
Case Management Note  Patient Details  Name: Jay Aguirre MRN: [id number] Date of Birth: 1995/05/07  Subjective/Objective:                    Action/Plan: After speaking to patients mother, CM was able to obtain the patient an appointment at the [facility]. Information on the AVS. CM has also encouraged the use of the [facility] pharmacy. CM following.  Expected Discharge Date:                  Expected Discharge Plan:  Home/Self Care  In-House Referral:     Discharge planning Services  CM Consult, MATCH Program, Indigent Health Clinic  Post Acute Care Choice:    Choice offered to:     DME Arranged:    DME Agency:     HH Arranged:    HH Agency:     Status of Service:  In process, will continue to follow  If discussed at Long Length of Stay Meetings, dates discussed:    Additional Comments:  Kermit Balo, RN 09/26/2016, 1:06 PM

## 2016-09-26 NOTE — Care Management Note (Signed)
Case Management Note  Patient Details  Name: Jay Aguirre MRN: [id number] Date of Birth: [birth year]  Subjective/Objective:                    Action/Plan: Pt discharging home with his family. CM provided the patients mother with [facility] letter and list of pharmacies for the [facility] letter.  Family to provide transportation home.   Expected Discharge Date:  09/26/16               Expected Discharge Plan:  Home/Self Care  In-House Referral:     Discharge planning Services  CM Consult, MATCH Program, Indigent Health Clinic  Post Acute Care Choice:    Choice offered to:     DME Arranged:    DME Agency:     HH Arranged:    HH Agency:     Status of Service:  Completed, signed off  If discussed at [employer], dates discussed:    Additional Comments:  Kermit Balo, RN 09/26/2016, 3:03 PM

## 2016-09-26 NOTE — Care Management Note (Signed)
Case Management Note  Patient Details  Name: Baldemar Fernando MRN: [id number] Date of Birth: 10/06/1995  Subjective/Objective:  Pt admitted on 09/23/16 with seizure activity, after using cocaine and ETOH.  PTA, pt independent, lives with parents.                     Action/Plan: Met with pt and mother today; referral to financial counselor, per mother's request for assistance with hospital bill.  Pt will need MATCH letter upon dc, as he is uninsured, and qualifies for assistance.  Would benefit from PCP follow up at [facility], and mom is agreeable.    Expected Discharge Date:                  Expected Discharge Plan:  Home/Self Care  In-House Referral:     Discharge planning Services  CM Consult, MATCH Program, Indigent Health Clinic  Post Acute Care Choice:    Choice offered to:     DME Arranged:    DME Agency:     HH Arranged:    HH Agency:     Status of Service:  In process, will continue to follow  If discussed at Long Length of Stay Meetings, dates discussed:    Additional Comments:  Julie W. Amerson, RN, BSN  Trauma/Neuro ICU Case Manager [phone] 

## 2016-09-26 NOTE — Progress Notes (Addendum)
Pt discharged at this time.  No complaints.  Verbalizes understanding of all discharge instructions and importance of medication compliance, and follow up appointments.  Pt also understands that he is not to drive for six months.  Family at bedside.  They have all belongings with them, including a ring that the father says is in his pocket.

## 2016-09-26 NOTE — Progress Notes (Signed)
Patient requesting chloraseptic spray. Paged Dr Otelia Limes, who gave verbal order for the spray.

## 2016-10-05 ENCOUNTER — Ambulatory Visit (INDEPENDENT_AMBULATORY_CARE_PROVIDER_SITE_OTHER): Payer: Self-pay | Admitting: Family Medicine

## 2016-10-05 ENCOUNTER — Encounter: Payer: Self-pay | Admitting: Family Medicine

## 2016-10-05 VITALS — BP 117/70 | HR 66 | Temp 98.1°F | Resp 16 | Ht 70.0 in | Wt 137.2 lb

## 2016-10-05 DIAGNOSIS — H6123 Impacted cerumen, bilateral: Secondary | ICD-10-CM

## 2016-10-05 DIAGNOSIS — G43709 Chronic migraine without aura, not intractable, without status migrainosus: Secondary | ICD-10-CM

## 2016-10-05 DIAGNOSIS — R569 Unspecified convulsions: Secondary | ICD-10-CM

## 2016-10-05 DIAGNOSIS — Z1389 Encounter for screening for other disorder: Secondary | ICD-10-CM

## 2016-10-05 LAB — POCT URINALYSIS DIP (DEVICE)
Bilirubin Urine: NEGATIVE
GLUCOSE, UA: NEGATIVE mg/dL
Hgb urine dipstick: NEGATIVE
Ketones, ur: NEGATIVE mg/dL
LEUKOCYTES UA: NEGATIVE
NITRITE: NEGATIVE
PROTEIN: NEGATIVE mg/dL
Specific Gravity, Urine: 1.02 (ref 1.005–1.030)
UROBILINOGEN UA: 0.2 mg/dL (ref 0.0–1.0)
pH: 7 (ref 5.0–8.0)

## 2016-10-05 MED ORDER — OFLOXACIN 0.3 % OT SOLN: 5.0000 [drp] | Freq: Two times a day (BID) | OTIC | 0 refills | Status: AC

## 2016-10-05 MED ORDER — BENZONATATE 100 MG PO CAPS: 100.0000 mg | ORAL_CAPSULE | Freq: Three times a day (TID) | ORAL | 0 refills | Status: AC | PRN

## 2016-10-05 MED ORDER — SUMATRIPTAN SUCCINATE 100 MG PO TABS: 50.0000 mg | ORAL_TABLET | ORAL | 0 refills | Status: AC | PRN

## 2016-10-05 MED FILL — SUMATRIPTAN SUCC 100 MG TAB: 100 | 30 days supply | Qty: 9 | Fill #0

## 2016-10-05 MED FILL — BENZONATATE 100 MG CAPSULE: 100 | 6 days supply | Qty: 40 | Fill #0

## 2016-10-05 NOTE — Patient Instructions (Addendum)
For cough, benzonatate as needed for cough. If cough lasts greater than 7 days, follow-up with me by phone to obtain a check x-ray.  For headaches, start Imitrex 100 mg at the onset of headache and do not exceed 200 mg within 24 hours.   Resume Floxin ear drops twice daily to right ear for ear infection.   Keep follow-up with neurology.     Seizure, Adult When you have a seizure:  Parts of your body may move.  How aware or awake (conscious) you are may change.  You may shake (convulse).  Some people have symptoms right before a seizure happens. These symptoms may include:  Fear.  Worry (anxiety).  Feeling like you are going to throw up (nausea).  Feeling like the room is spinning (vertigo).  Feeling like you saw or heard something before (deja vu).  Odd tastes or smells.  Changes in vision, such as seeing flashing lights or spots.  Seizures usually last from 30 seconds to 2 minutes. Usually, they are not harmful unless they last a long time. Follow these instructions at home: Medicines  Take over-the-counter and prescription medicines only as told by your doctor.  Avoid anything that may keep your medicine from working, such as alcohol. Activity  Do not do any activities that would be dangerous if you had another seizure, like driving or swimming. Wait until your doctor approves.  If you live in the [place] ask your local DMV (department of motor vehicles) when you can drive.  Rest. Teaching others  Teach friends and family what to do when you have a seizure. They should: ? Lay you on the ground. ? Protect your head and body. ? Loosen any tight clothing around your neck. ? Turn you on your side. ? Stay with you until you are better. ? Not hold you down. ? Not put anything in your mouth. ? Know whether or not you need emergency care. General instructions  Contact your doctor each time you have a seizure.  Avoid anything that gives you seizures.  Keep a  seizure diary. Write down: ? What you think caused each seizure. ? What you remember about each seizure.  Keep all follow-up visits as told by your doctor. This is important. Contact a doctor if:  You have another seizure.  You have seizures more often.  There is any change in what happens during your seizures.  You continue to have seizures with treatment.  You have symptoms of being sick or having an infection. Get help right away if:  You have a seizure: ? That lasts longer than 5 minutes. ? That is different than seizures you had before. ? That makes it harder to breathe. ? After you hurt your head.  After a seizure, you cannot speak or use a part of your body.  After a seizure, you are confused or have a bad headache.  You have two or more seizures in a row.  You are having seizures more often.  You do not wake up right after a seizure.  You get hurt during a seizure. In an emergency:  These symptoms may be an emergency. Do not wait to see if the symptoms will go away. Get medical help right away. Call your local emergency services (911 in the [place] Do not drive yourself to the hospital. This information is not intended to replace advice given to you by your health care provider. Make sure you discuss any questions you have with your health care  provider. Document Released: 07/12/2007 Document Revised: 10/06/2015 Document Reviewed: 10/06/2015 Elsevier Interactive Patient Education  2017 [employer]
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
## 2016-10-11 ENCOUNTER — Encounter: Payer: Self-pay | Admitting: Family Medicine

## 2016-10-11 ENCOUNTER — Ambulatory Visit (INDEPENDENT_AMBULATORY_CARE_PROVIDER_SITE_OTHER): Payer: Self-pay | Admitting: Family Medicine

## 2016-10-11 VITALS — BP 100/60 | HR 84 | Temp 97.6°F | Resp 16 | Ht 70.0 in | Wt 136.0 lb

## 2016-10-11 DIAGNOSIS — H6123 Impacted cerumen, bilateral: Secondary | ICD-10-CM

## 2016-10-11 DIAGNOSIS — Z23 Encounter for immunization: Secondary | ICD-10-CM

## 2016-10-11 NOTE — Progress Notes (Signed)
Ear irrigation only.

## 2016-10-24 ENCOUNTER — Other Ambulatory Visit: Payer: Self-pay | Admitting: Family Medicine

## 2016-10-24 ENCOUNTER — Telehealth: Payer: Self-pay

## 2016-10-24 MED ORDER — LEVETIRACETAM 500 MG PO TABS: 500.0000 mg | ORAL_TABLET | Freq: Two times a day (BID) | ORAL | 3 refills | Status: AC

## 2016-10-24 MED ORDER — LEVETIRACETAM 500 MG PO TABS: 500.0000 mg | ORAL_TABLET | Freq: Two times a day (BID) | ORAL | 3 refills | Status: DC

## 2016-10-24 MED FILL — levETIRAcetam 500 MG TABS: 500 | 30 days supply | Qty: 60 | Fill #0

## 2016-10-24 NOTE — Progress Notes (Signed)
Keppra refilled.

## 2016-11-10 ENCOUNTER — Encounter: Payer: Self-pay | Admitting: Neurology

## 2016-11-10 ENCOUNTER — Ambulatory Visit (INDEPENDENT_AMBULATORY_CARE_PROVIDER_SITE_OTHER): Payer: Self-pay | Admitting: Neurology

## 2016-11-10 VITALS — BP 112/68 | HR 57 | Ht 70.0 in | Wt 134.0 lb

## 2016-11-10 DIAGNOSIS — R569 Unspecified convulsions: Secondary | ICD-10-CM
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
## 2016-11-10 NOTE — Patient Instructions (Signed)
1. Schedule 1-hour sleep-deprived EEG for Monday morning. Do not take the Keppra for now. Our office will call you after the test about the results. If any seizures in between then, call our office 2. As per [place] driving laws, no driving until 6 months seizure-free  Seizure Precautions: 1. If medication has been prescribed for you to prevent seizures, take it exactly as directed.  Do not stop taking the medicine without talking to your doctor first, even if you have not had a seizure in a long time.   2. Avoid activities in which a seizure would cause danger to yourself or to others.  Don't operate dangerous machinery, swim alone, or climb in high or dangerous places, such as on ladders, roofs, or girders.  Do not drive unless your doctor says you may.  3. If you have any warning that you may have a seizure, lay down in a safe place where you can't hurt yourself.    4.  No driving for 6 months from last seizure, as per [facility].   Please refer to the following link on the [employer]'s website for more information: http://www.epilepsyfoundation.org/answerplace/Social/driving/drivingu.cfm   5.  Maintain good sleep hygiene. Avoid alcohol.  6.  Contact your doctor if you have any problems that may be related to the medicine you are taking.  7.  Call 911 and bring the patient back to the ED if:        A.  The seizure lasts longer than 5 minutes.       B.  The patient doesn't awaken shortly after the seizure  C.  The patient has new problems such as difficulty seeing, speaking or moving  D.  The patient was injured during the seizure  E.  The patient has a temperature over 102 F (39C)  F.  The patient vomited and now is having trouble breathing

## 2016-11-13 ENCOUNTER — Ambulatory Visit (INDEPENDENT_AMBULATORY_CARE_PROVIDER_SITE_OTHER): Payer: Self-pay | Admitting: Neurology

## 2016-11-13 DIAGNOSIS — R569 Unspecified convulsions: Secondary | ICD-10-CM

## 2016-11-13 NOTE — Procedures (Signed)
ELECTROENCEPHALOGRAM REPORT  Date of Study: 11/13/2016  Patient's Name: Jay Aguirre MRN: [id number] Date of Birth: 01-15-1996  Referring Provider: Dr. Patrcia Dolly  Clinical History: This is a 21 year old with multiple seizures in one day.  Medications: Last dose Keppra 5 days prior. Tessalon Imitrex Advin Floxin Otic drops  Technical Summary: A multichannel digital 1-hour sleep-deprived EEG recording measured by the international 10-20 system with electrodes applied with paste and impedances below 5000 ohms performed in our laboratory with EKG monitoring in an awake and asleep patient.  Hyperventilation and photic stimulation were performed.  The digital EEG was referentially recorded, reformatted, and digitally filtered in a variety of bipolar and referential montages for optimal display.    Description: The patient is awake and asleep during the recording.  During maximal wakefulness, there is a symmetric, medium voltage 10 Hz posterior dominant rhythm that attenuates with eye opening.  The record is symmetric.  During drowsiness and sleep, there is an increase in theta slowing of the background.  Vertex waves and symmetric sleep spindles were seen.  Hyperventilation and photic stimulation did not elicit any abnormalities.  There were no epileptiform discharges or electrographic seizures seen.    EKG lead was unremarkable.  Impression: This 1-hour awake and asleep EEG is normal.    Clinical Correlation: A normal EEG does not exclude a clinical diagnosis of epilepsy.  If further clinical questions remain, prolonged EEG may be helpful.  Clinical correlation is advised.   Patrcia Dolly, M.D.

## 2016-11-14 ENCOUNTER — Telehealth: Payer: Self-pay

## 2016-11-14 NOTE — Telephone Encounter (Signed)
Called number listed for pt - voicemail said it was [employer]?  LMOVM asking for return call to the office.

## 2016-11-14 NOTE — Telephone Encounter (Signed)
-----   Message from Van Clines, MD sent at 11/13/2016  4:38 PM EDT ----- Pls let him know the brain wave test was normal, no clear indication to restart the Keppra at this point. Certainly if any changes in symptoms, please call. No driving until 6 months seizure-free, thanks

## 2016-11-15 NOTE — Telephone Encounter (Signed)
Pt returned call.  Relayed message below.  Pt asked that EEG report and recent progress notes be faxed to his attorney.  Fax sent / received confirmation

## 2016-12-18 ENCOUNTER — Ambulatory Visit: Payer: Self-pay | Admitting: Family Medicine

## 2018-06-04 IMAGING — DX DG CHEST 1V PORT
1 series · 1 of 1 positions shown · non-contrast
Comparison: 09/23/2016 at [DATE] a.m.

CLINICAL DATA: Followup for acute respiratory failure.

EXAM:
PORTABLE CHEST 1 VIEW

[chest ap]
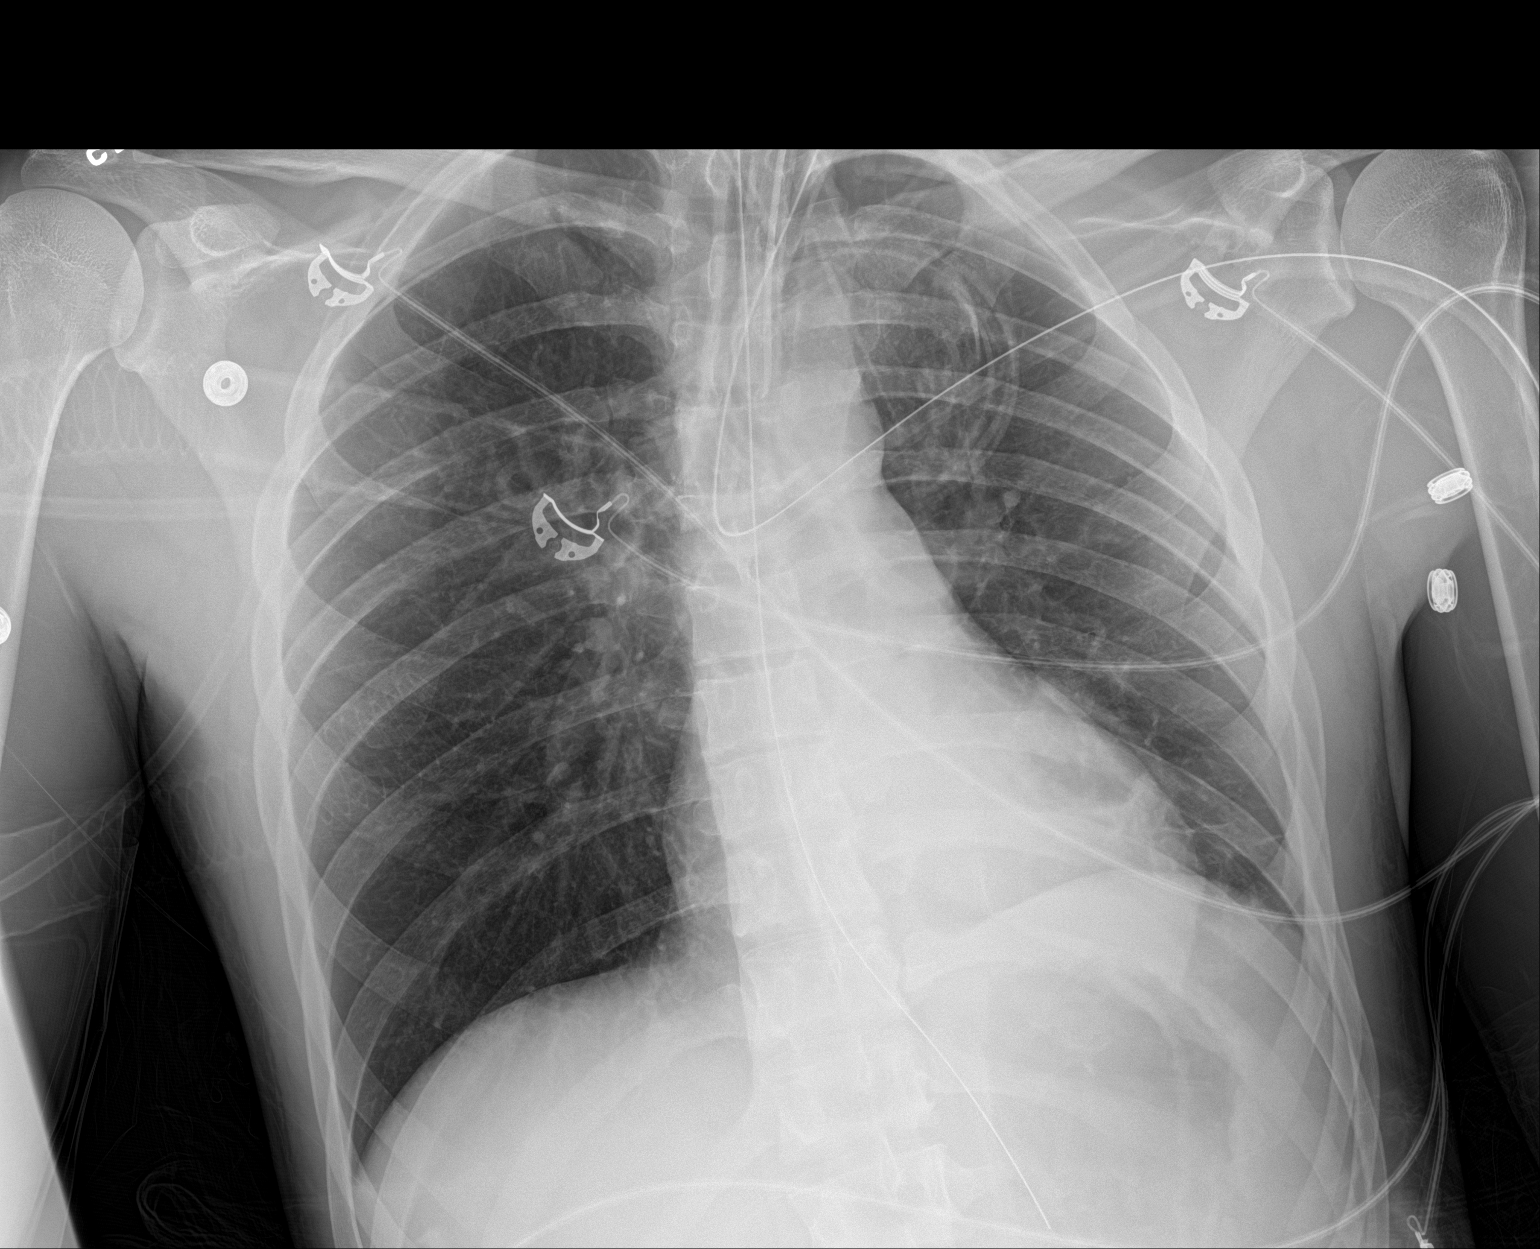

[1 of 1 positions shown; findings below may reference images not displayed]

FINDINGS: Lungs remain clear. Cardiac silhouette is normal in size. No
mediastinal or hilar masses.

Endotracheal tube tip projects 16 mm above that carina.
Nasal/orogastric tube passes well below the diaphragm into the
stomach.
IMPRESSION: 1. No acute cardiopulmonary disease.
2. Support apparatus is stable and well positioned.

## 2018-06-04 NOTE — Telephone Encounter (Signed)
Message sent to provider
